# Patient Record
Sex: Male | Born: 2000
Health system: Southern US, Community
[De-identification: ages and names within clinical notes are randomized; demographics above are authoritative.]

## PROBLEM LIST (undated history)

## (undated) HISTORY — PX: TYMPANOSTOMY TUBE PLACEMENT: SHX32

## (undated) HISTORY — PX: HYDROCELE EXCISION / REPAIR: SUR1145

---
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
## 2020-02-04 ENCOUNTER — Encounter: Payer: Self-pay | Admitting: *Deleted

## 2020-02-05 ENCOUNTER — Encounter: Payer: Self-pay | Admitting: Diagnostic Neuroimaging

## 2020-02-05 ENCOUNTER — Ambulatory Visit (INDEPENDENT_AMBULATORY_CARE_PROVIDER_SITE_OTHER): Payer: BC Managed Care – PPO | Admitting: Diagnostic Neuroimaging

## 2020-02-05 VITALS — BP 117/73 | HR 70 | Ht 74.0 in | Wt 148.0 lb

## 2020-02-05 DIAGNOSIS — G252 Other specified forms of tremor: Secondary | ICD-10-CM | POA: Diagnosis not present

## 2020-02-05 NOTE — Progress Notes (Signed)
[facility]  PATIENT: Jeff Boyd DOB: 02/15/2001  REFERRING CLINICIAN: Loyola Mast, MD HISTORY FROM: patient and mother  REASON FOR VISIT: new consult    HISTORICAL  CHIEF COMPLAINT:  Chief Complaint  Patient presents with  . Hand tremors    rm 6 New Pt mom- Jeff Boyd "both hands x 1 1/2 years"    HISTORY OF PRESENT ILLNESS:   19 year old male here for evaluation of tremor.  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  This was about 1-1/2 years ago.  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  Symptoms are stable.  No family history of tremor.   REVIEW OF SYSTEMS: Full 14 system review of systems performed and negative with exception of: As per HPI.  ALLERGIES: No Known Allergies  HOME MEDICATIONS: Outpatient Medications Prior to Visit  Medication Sig Dispense Refill  . albuterol (VENTOLIN HFA) 108 (90 Base) MCG/ACT inhaler Use 2 puffs every 4 hours  as needed for wheezing or  shortness of breath 1 Inhaler 1  . budesonide (PULMICORT FLEXHALER) 180 MCG/ACT inhaler INHALE 1 PUFF INTO THE  LUNGS DAILY. INCREASE TO 3  INHALATIONS 3 TIMES DAILY  WITH FLAREUP AS DIRECTED 1 each 5  . cetirizine (ZYRTEC) 10 MG tablet Take 1 tablet (10 mg total) by mouth daily. 30 tablet 5  . bacitracin ointment Apply 1 application topically 2 (two) times daily. (Patient not taking: Reported on 02/05/2020) 120 g 0  . HYDROcodone-acetaminophen (NORCO/VICODIN) 5-325 MG tablet Take 1 tablet by mouth every 6 (six) hours as needed. (Patient not taking: Reported on 02/05/2020) 10 tablet 0  . ibuprofen (ADVIL,MOTRIN) 200 MG tablet Take 400 mg by mouth every 6 (six) hours as needed. Reported on 07/07/2015 (Patient not taking: Reported on 02/05/2020)     No facility-administered medications prior to visit.    PAST MEDICAL  HISTORY: Past Medical History:  Diagnosis Date  . Asthma     PAST SURGICAL HISTORY: Past Surgical History:  Procedure Laterality Date  . HYDROCELE EXCISION / REPAIR  2004  . TYMPANOSTOMY TUBE PLACEMENT      FAMILY HISTORY: Family History  Problem Relation Age of Onset  . Lymphoma Mother   . Prostate cancer Father   . Stroke Paternal Grandmother   . Diabetes Paternal Grandmother   . Heart attack Paternal Grandfather   . Throat cancer Maternal Grandfather     SOCIAL HISTORY: Social History   Socioeconomic History  . Marital status: Single    Spouse name: Not on file  . Number of children: 0  . Years of education: Not on file  . Highest education level: High school graduate  Occupational History    Comment: student [employer]    Comment: restaurant  Tobacco Use  . Smoking status: Never Smoker  . Smokeless tobacco: Never Used  Substance and Sexual Activity  . Alcohol use: No  . Drug use: No  . Sexual activity: Not on file  Other Topics Concern  . Not on file  Social History Narrative   150 mg caffeine, pre work out drink   Social Determinants of Corporate investment banker Strain:   . Difficulty of Paying Living Expenses:   Food Insecurity:   . Worried About Programme researcher, broadcasting/film/video in the Last Year:   . [employer]  of Food in the Last Year:   Transportation Needs:   . Freight forwarder (Medical):   Marland Kitchen Lack of Transportation (Non-Medical):   Physical Activity:   . Days of Exercise per Week:   . Minutes of Exercise per Session:   Stress:   . Feeling of Stress :   Social Connections:   . Frequency of Communication with Friends and Family:   . Frequency of Social Gatherings with Friends and Family:   . Attends Religious Services:   . Active Member of Clubs or Organizations:   . Attends Banker Meetings:   Marland Kitchen Marital Status:   Intimate Partner Violence:   . Fear of Current or Ex-Partner:   . Emotionally Abused:   Marland Kitchen Physically Abused:   . Sexually  Abused:      PHYSICAL EXAM  GENERAL EXAM/CONSTITUTIONAL: Vitals:  Vitals:   02/05/20 1011  BP: 117/73  Pulse: 70  Weight: 148 lb (67.1 kg)  Height: 6\' 2"  (1.88 m)     Body mass index is 19 kg/m. Wt Readings from Last 3 Encounters:  02/05/20 148 lb (67.1 kg) (39 %, Z= -0.27)*  12/13/18 134 lb (60.8 kg) (23 %, Z= -0.75)*  12/11/18 130 lb (59 kg) (17 %, Z= -0.97)*   * Growth percentiles are based on CDC (Boys, 2-20 Years) data.     Patient is in no distress; well developed, nourished and groomed; neck is supple  CARDIOVASCULAR:  Examination of carotid arteries is normal; no carotid bruits  Regular rate and rhythm, no murmurs  Examination of peripheral vascular system by observation and palpation is normal  EYES:  Ophthalmoscopic exam of optic discs and posterior segments is normal; no papilledema or hemorrhages  No exam data present  MUSCULOSKELETAL:  Gait, strength, tone, movements noted in Neurologic exam below  NEUROLOGIC: MENTAL STATUS:  No flowsheet data found.  awake, alert, oriented to person, place and time  recent and remote memory intact  normal attention and concentration  language fluent, comprehension intact, naming intact  fund of knowledge appropriate  CRANIAL NERVE:   2nd - no papilledema on fundoscopic exam  2nd, 3rd, 4th, 6th - pupils equal and reactive to light, visual fields full to confrontation, extraocular muscles intact, no nystagmus  5th - facial sensation symmetric  7th - facial strength symmetric  8th - hearing intact  9th - palate elevates symmetrically, uvula midline  11th - shoulder shrug symmetric  12th - tongue protrusion midline  MOTOR:   normal bulk and tone, full strength in the BUE, BLE  FINE POSTURAL TREMOR IN HANDS  SENSORY:   normal and symmetric to light touch, temperature, vibration  COORDINATION:   finger-nose-finger, fine finger movements normal  REFLEXES:   deep tendon reflexes  present and symmetric  GAIT/STATION:   narrow based gait     DIAGNOSTIC DATA (LABS, IMAGING, TESTING) - I reviewed patient records, labs, notes, testing and imaging myself where available.  Lab Results  Component Value Date   WBC 5.9 09/04/2018   HGB 13.9 09/04/2018   HCT 41.9 09/04/2018   MCV 85.7 09/04/2018   PLT 250 09/04/2018      Component Value Date/Time   NA 137 09/04/2018 0015   K 3.6 09/04/2018 0015   CL 104 09/04/2018 0015   CO2 26 09/04/2018 0015   GLUCOSE 83 09/04/2018 0015   BUN 10 09/04/2018 0015   CREATININE 0.48 (L) 09/04/2018 0015   CALCIUM 9.5 09/04/2018 0015   PROT 7.3 09/04/2018 0015  ALBUMIN 4.5 09/04/2018 0015   AST 15 09/04/2018 0015   ALT 11 09/04/2018 0015   ALKPHOS 74 09/04/2018 0015   BILITOT 2.4 (H) 09/04/2018 0015   GFRNONAA >60 09/04/2018 0015   GFRAA >60 09/04/2018 0015   No results found for: CHOL, HDL, LDLCALC, LDLDIRECT, TRIG, CHOLHDL No results found for: XYIA1K No results found for: VITAMINB12 No results found for: TSH      ASSESSMENT AND PLAN  19 y.o. year old male here with mild, fine postural tremor in outstretched arms, likely represents enhanced physiologic tremor.  Will check lab testing to rule out other causes.  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 Symptoms are not progressive at this time.  Recommend to monitor.    Dx:  1. Postural tremor     PLAN:  POSTURAL TREMOR (enhanced physiologic tremor) - check labs  Orders Placed This Encounter  Procedures  . TSH  . CBC with Differential/Platelet  . Comprehensive metabolic panel   Return for pending if symptoms worsen or fail to improve.    Suanne Marker, MD 02/05/2020, 10:44 AM Certified in Neurology, Neurophysiology and Neuroimaging  [facility] [address], [address] [place], [place] [address] [phone]

## 2020-02-06 ENCOUNTER — Telehealth: Payer: Self-pay | Admitting: *Deleted

## 2020-02-06 LAB — CBC WITH DIFFERENTIAL/PLATELET
Basophils Absolute: 0.1 10*3/uL (ref 0.0–0.2)
Basos: 1 %
EOS (ABSOLUTE): 0.3 10*3/uL (ref 0.0–0.4)
Eos: 7 %
Hematocrit: 46.6 % (ref 37.5–51.0)
Hemoglobin: 15.4 g/dL (ref 13.0–17.7)
Immature Grans (Abs): 0 10*3/uL (ref 0.0–0.1)
Immature Granulocytes: 0 %
Lymphocytes Absolute: 2 10*3/uL (ref 0.7–3.1)
Lymphs: 45 %
MCH: 28.7 pg (ref 26.6–33.0)
MCHC: 33 g/dL (ref 31.5–35.7)
MCV: 87 fL (ref 79–97)
Monocytes Absolute: 0.4 10*3/uL (ref 0.1–0.9)
Monocytes: 8 %
Neutrophils Absolute: 1.7 10*3/uL (ref 1.4–7.0)
Neutrophils: 39 %
Platelets: 221 10*3/uL (ref 150–450)
RBC: 5.36 x10E6/uL (ref 4.14–5.80)
RDW: 12.9 % (ref 11.6–15.4)
WBC: 4.4 10*3/uL (ref 3.4–10.8)

## 2020-02-06 LAB — COMPREHENSIVE METABOLIC PANEL
ALT: 11 IU/L (ref 0–44)
AST: 14 IU/L (ref 0–40)
Albumin/Globulin Ratio: 2 (ref 1.2–2.2)
Albumin: 5.1 g/dL (ref 4.1–5.2)
Alkaline Phosphatase: 103 IU/L (ref 55–125)
BUN/Creatinine Ratio: 24 — ABNORMAL HIGH (ref 9–20)
BUN: 17 mg/dL (ref 6–20)
Bilirubin Total: 3.9 mg/dL — ABNORMAL HIGH (ref 0.0–1.2)
CO2: 24 mmol/L (ref 20–29)
Calcium: 10.4 mg/dL — ABNORMAL HIGH (ref 8.7–10.2)
Chloride: 98 mmol/L (ref 96–106)
Creatinine, Ser: 0.72 mg/dL — ABNORMAL LOW (ref 0.76–1.27)
GFR calc Af Amer: 156 mL/min/{1.73_m2} (ref >59)
GFR calc non Af Amer: 135 mL/min/{1.73_m2} (ref >59)
Globulin, Total: 2.5 g/dL (ref 1.5–4.5)
Glucose: 81 mg/dL (ref 65–99)
Potassium: 4.6 mmol/L (ref 3.5–5.2)
Sodium: 137 mmol/L (ref 134–144)
Total Protein: 7.6 g/dL (ref 6.0–8.5)

## 2020-02-06 LAB — TSH: TSH: 1.49 u[IU]/mL (ref 0.450–4.500)

## 2020-02-06 NOTE — Telephone Encounter (Signed)
LVM requesting call back for lab results.  

## 2020-02-06 NOTE — Telephone Encounter (Signed)
Pt returned phone call.  

## 2020-02-06 NOTE — Telephone Encounter (Signed)
Called patient and informed him his  labs re ok, except the bilirubin is still high (first noted 1 year ago). Dr Marjory Lies stated it is not likely related to tremor, but needs follow up with PCP. Patient verbalized understanding, appreciation.

## 2020-06-23 ENCOUNTER — Other Ambulatory Visit: Payer: Self-pay

## 2020-06-23 ENCOUNTER — Ambulatory Visit (INDEPENDENT_AMBULATORY_CARE_PROVIDER_SITE_OTHER): Payer: BC Managed Care – PPO | Admitting: Allergy and Immunology

## 2020-06-23 ENCOUNTER — Encounter: Payer: Self-pay | Admitting: Allergy and Immunology

## 2020-06-23 VITALS — BP 130/92 | HR 107 | Temp 99.6°F | Resp 16 | Ht 74.0 in | Wt 154.4 lb

## 2020-06-23 DIAGNOSIS — J3089 Other allergic rhinitis: Secondary | ICD-10-CM

## 2020-06-23 DIAGNOSIS — J4530 Mild persistent asthma, uncomplicated: Secondary | ICD-10-CM

## 2020-06-23 MED ORDER — ALBUTEROL SULFATE HFA 108 (90 BASE) MCG/ACT IN AERS: INHALATION_SPRAY | RESPIRATORY_TRACT | 2 refills | Status: DC

## 2020-06-23 MED ORDER — PULMICORT FLEXHALER 180 MCG/ACT IN AEPB: INHALATION_SPRAY | RESPIRATORY_TRACT | 5 refills | Status: DC

## 2020-06-23 NOTE — Progress Notes (Signed)
[place] [facility] [place] [facility] [place] [facility] [place] [facility] [place]   Follow-up Note  Referring Provider: Loyola Mast, MD Primary Provider: Loyola Mast, MD Date of Office Visit: 06/23/2020  Subjective:   Jeff Boyd (DOB: 2000/07/14) is a 19 y.o. male who [facility]2020 in re-evaluation of the following:  HPI: Jeff Boyd returns to this clinic in evaluation of asthma and allergic rhinitis.  His last visit to this clinic was 03 January 2018.  He did well for years without the use of any controller agents but unfortunately he developed a "cold" in October 2021 and since then has been having recurrent issues with wheezing and coughing especially upon awakening in the morning and uses a bronchodilator on a daily basis.  His "cold" involving his upper airways has completely resolved without any upper airway symptomatology at this point in time.  He does not have any issues with reflux.  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  In the past he has had very good results while utilizing low-dose Pulmicort to control his asthma.  He has received 2 Pfizer Covid vaccines.  He has not received the flu vaccine to date.  Allergies as of 06/23/2020   No Known Allergies     Medication List    albuterol 108 (90 Base) MCG/ACT inhaler Commonly known as: Ventolin HFA Use 2 puffs every 4 hours  as needed for wheezing or  shortness of breath   cetirizine 10 MG tablet Commonly known as: ZYRTEC Take 1 tablet (10 mg total) by mouth daily.   Pulmicort Flexhaler 180 MCG/ACT inhaler Generic drug: budesonide 2 puffs 3-7 times per week depending on disease activity What changed: additional instructions Changed by: Jeff Boyd Jeff Pollock, MD       Past Medical History:  Diagnosis Date  . Asthma     Past Surgical History:  Procedure  Laterality Date  . HYDROCELE EXCISION / REPAIR  2004  . TYMPANOSTOMY TUBE PLACEMENT      Review of systems negative except as noted in HPI / PMHx or noted below:  Review of Systems  Constitutional: Negative.   HENT: Negative.   Eyes: Negative.   Respiratory: Negative.   Cardiovascular: Negative.   Gastrointestinal: Negative.   Genitourinary: Negative.   Musculoskeletal: Negative.   Skin: Negative.   Neurological: Negative.   Endo/Heme/Allergies: Negative.   Psychiatric/Behavioral: Negative.      Objective:   Vitals:   06/23/20 0855  BP: (!) 130/92  Pulse: (!) 107  Resp: 16  Temp: 99.6 F (37.6 C)  SpO2: 96%   Height: 6\' 2"  (188 cm)  Weight: 154 lb 6.4 oz (70 kg)   Physical Exam Constitutional:      Appearance: He is not diaphoretic.  HENT:     Head: Normocephalic.     Right Ear: Tympanic membrane, ear canal and external ear normal.     Left Ear: Tympanic membrane, ear canal and external ear normal.     Nose: Nose normal. No mucosal edema or rhinorrhea.     Mouth/Throat:     Mouth: Oropharynx is clear and moist and mucous membranes are normal.     Pharynx: Uvula midline. No oropharyngeal exudate.  Eyes:     Conjunctiva/sclera: Conjunctivae normal.  Neck:     Thyroid: No thyromegaly.     Trachea: Trachea normal. No  tracheal tenderness or tracheal deviation.  Cardiovascular:     Rate and Rhythm: Normal rate and regular rhythm.     Heart sounds: Normal heart sounds, S1 normal and S2 normal. No murmur heard.   Pulmonary:     Effort: No respiratory distress.     Breath sounds: Normal breath sounds. No stridor. No wheezing or rales.  Musculoskeletal:        General: No edema.  Lymphadenopathy:     Head:     Right side of head: No tonsillar adenopathy.     Left side of head: No tonsillar adenopathy.     Cervical: No cervical adenopathy.  Skin:    Findings: No erythema or rash.     Nails: There is no clubbing.  Neurological:     Mental Status: He is  alert.     Diagnostics:    Spirometry was performed and demonstrated an FEV1 of 3.64 at 70 % of predicted.  Assessment and Plan:   1. Not well controlled mild persistent asthma   2. Other allergic rhinitis     1.  Pulmicort 180 -2 inhalations 3-7 times per week depending on disease activity  2.  If needed:   A.  Albuterol HFA-2 inhalations every 4-6 hours  B.  OTC antihistamine  3.  Prednisone 10 mg -2 tablets daily x 5 days, then 1 tablet daily x 5 days  4.  Obtain fall flu vaccine  5.  Return to clinic in 1 year or earlier if problem  I will have Jeff Boyd restart his Pulmicort and I given him some oral steroids today to jumpstart the anti-inflammatory effect of this approach.  Assuming he does well with this plan I will see him back in his clinic in approximately 1 year.  He does have a very good understanding of his disease state and understands about adjusting the dose of Pulmicort depending on disease activity.  Jeff Schimke, MD Allergy / Immunology [facility]

## 2020-06-23 NOTE — Patient Instructions (Addendum)
  1.  Pulmicort 180 -2 inhalations 3-7 times per week depending on disease activity  2.  If needed:   A.  Albuterol HFA-2 inhalations every 4-6 hours  B.  OTC antihistamine  3.  Prednisone 10 mg -2 tablets daily x 5 days, then 1 tablet daily x 5 days  4.  Obtain fall flu vaccine  5.  Return to clinic in 1 year or earlier if problem

## 2020-06-24 ENCOUNTER — Encounter: Payer: Self-pay | Admitting: Allergy and Immunology

## 2022-07-12 ENCOUNTER — Ambulatory Visit: Payer: BC Managed Care – PPO | Admitting: Allergy and Immunology

## 2022-07-12 DIAGNOSIS — J309 Allergic rhinitis, unspecified: Secondary | ICD-10-CM

## 2022-08-16 ENCOUNTER — Ambulatory Visit: Payer: BC Managed Care – PPO | Admitting: Internal Medicine

## 2022-08-21 NOTE — Patient Instructions (Incomplete)
For now and during asthma flares/upper respiratory infections start  Pulmicort 180 mcg -2 inhalations twice a day until symptoms return to baseline   Asthma control goals:  Full participation in all desired activities (may need albuterol before activity) Albuterol use two time or less a week on average (not counting use with activity) Cough interfering with sleep two time or less a month Oral steroids no more than once a year No hospitalizations  2.  If needed:               A.  Albuterol HFA-2 inhalations every 4-6 hours             B.  OTC antihistamine  C. Flonase 1-2 sprays in each nostril once a day if needed for stuffy nose. In the right nostril, point the applicator out toward the right ear. In the left nostril, point the applicator out toward the left ear. Reviewed stopping Flonase if notices correlation of use with nose bleeds  3. Epistaxis ( nose bleeds) Pinch both nostrils while leaning forward for at least 5 minutes before checking to see if the bleeding has stopped. If bleeding is not controlled within 5-10 minutes apply a cotton ball soaked with oxymetazoline (Afrin) to the bleeding nostril for a few seconds.  If the problem persists or worsens a referral to ENT for further evaluation may be necessary.  4 Reflux Start dietary and lifestyle modifications as below    4.  Return to clinic in 6 months or earlier if problem   Lifestyle Changes for Controlling GERD When you have GERD, stomach acid feels as if it's backing up toward your mouth. Whether or not you take medication to control your GERD, your symptoms can often be improved with lifestyle changes.   Raise Your Head Reflux is more likely to strike when you're lying down flat, because stomach fluid can flow backward more easily. Raising the head of your bed 4-6 inches can help. To do this: Slide blocks or books under the legs at the head of your bed. Or, place a wedge under the mattress. Many foam stores can make a  suitable wedge for you. The wedge should run from your waist to the top of your head. Don't just prop your head on several pillows. This increases pressure on your stomach. It can make GERD worse.  Watch Your Eating Habits Certain foods may increase the acid in your stomach or relax the lower esophageal sphincter, making GERD more likely. It's best to avoid the following: Coffee, tea, and carbonated drinks (with and without caffeine) Fatty, fried, or spicy food Mint, chocolate, onions, and tomatoes Any other foods that seem to irritate your stomach or cause you pain  Relieve the Pressure Eat smaller meals, even if you have to eat more often. Don't lie down right after you eat. Wait a few hours for your stomach to empty. Avoid tight belts and tight-fitting clothes. Lose excess weight.  Tobacco and Alcohol Avoid smoking tobacco and drinking alcohol. They can make GERD symptoms worse.

## 2022-08-23 ENCOUNTER — Other Ambulatory Visit: Payer: Self-pay

## 2022-08-23 ENCOUNTER — Encounter: Payer: Self-pay | Admitting: Family

## 2022-08-23 ENCOUNTER — Ambulatory Visit (INDEPENDENT_AMBULATORY_CARE_PROVIDER_SITE_OTHER): Payer: BC Managed Care – PPO | Admitting: Family

## 2022-08-23 VITALS — BP 130/70 | HR 72 | Temp 97.5°F | Resp 16 | Ht 74.0 in | Wt 170.3 lb

## 2022-08-23 DIAGNOSIS — J4530 Mild persistent asthma, uncomplicated: Secondary | ICD-10-CM

## 2022-08-23 DIAGNOSIS — J3089 Other allergic rhinitis: Secondary | ICD-10-CM

## 2022-08-23 DIAGNOSIS — K219 Gastro-esophageal reflux disease without esophagitis: Secondary | ICD-10-CM

## 2022-08-23 DIAGNOSIS — R040 Epistaxis: Secondary | ICD-10-CM

## 2022-08-23 MED ORDER — ALBUTEROL SULFATE (2.5 MG/3ML) 0.083% IN NEBU: 2.5000 mg | INHALATION_SOLUTION | Freq: Four times a day (QID) | RESPIRATORY_TRACT | 1 refills | Status: AC | PRN

## 2022-08-23 MED ORDER — ALBUTEROL SULFATE HFA 108 (90 BASE) MCG/ACT IN AERS: INHALATION_SPRAY | RESPIRATORY_TRACT | 1 refills | Status: AC

## 2022-08-23 MED ORDER — PULMICORT FLEXHALER 180 MCG/ACT IN AEPB: INHALATION_SPRAY | RESPIRATORY_TRACT | 5 refills | Status: AC

## 2022-08-23 NOTE — Addendum Note (Signed)
Addended by: Eloy End D on: 08/23/2022 03:41 PM   Modules accepted: Orders

## 2022-08-23 NOTE — Progress Notes (Signed)
[place] [id number] Dept: [phone]  FOLLOW UP NOTE  Patient ID: Jeff Boyd, male    DOB: [birth year]  Age: 22 y.o. MRN: [id number] Date of Office Visit: 08/23/2022  Assessment  Chief Complaint: Medication Refill  HPI Jeff Boyd is a 22 year old male who presents today for follow-up of not well-controlled mild persistent asthma and allergic rhinitis.  He was last seen on June 23, 2020 by Dr. Neldon Mc.  He denies any new diagnosis or surgery since his last office visit.  His wife is here with him today and helps provide history.  Mild persistent asthma: He reports that he is just recently getting over influenza and with the influenza he has had slight coughing and wheezing.  He also reports shortness of breath when his allergies flareup.  This especially occurs when he goes to his in-laws house who have a cat.  He is allergic to cats.  He denies tightness in his chest and nocturnal awakenings due to breathing problems.  His wife reports that while he has had the influenza he has been using his albuterol 2-3 times a week.  Prior to having influenza he was using his albuterol maybe once a week.  He has not used Pulmicort Flexhaler 180 mcg in over a year.  Since his last office visit he has not required any systemic steroids or made any trips to the emergency room or urgent care due to breathing problems.  Allergic rhinitis: He reports once a week he will have rhinorrhea, nasal congestion, postnasal drip, and a nosebleed.  His nose bleed will last maybe 10 seconds and then will stop.  Also he mentions that when  he blows his nose he will sometimes see blood.  He denies nasal dryness.  He has not had any sinus infections since we last saw him.  He continues to take over-the-counter Zyrtec 10 mg once a day and very rarely uses Flonase nasal spray as needed.  He is not interested in this time and a referral to ENT for his nosebleeds.  Reflux: He reports reflux symptoms  that occur maybe once every other week.  He does drink 1-2 sodas a day and does not eat much chocolate.  He does not currently take any medication for reflux.   Drug Allergies:  No Known Allergies  Review of Systems: Review of Systems  Constitutional:  Negative for chills and fever.  HENT:  Positive for nosebleeds.        Reports rhinorrhea, nasal congestion, and postnasal drip that occurs approximately once a week.  He also reports nosebleeds that occur once a week.  Eyes:        Denies itchy watery eyes  Respiratory:  Positive for cough, shortness of breath and wheezing.        Reports light cough or wheeze since just getting over influenza.  He also reports shortness of breath with allergy flareups.  He reports that his wife's parents have a cat and he is allergic to cats.  Cardiovascular:  Negative for chest pain and palpitations.  Gastrointestinal:        Reports reflux symptoms once every other week  Genitourinary:  Negative for frequency.  Skin:  Negative for itching and rash.  Neurological:  Negative for headaches.  Endo/Heme/Allergies:  Positive for environmental allergies.     Physical Exam: BP 130/70   Pulse 72   Temp (!) 97.5 F (36.4 C) (Temporal)   Resp 16   Ht '6\' 2"'$  (  1.88 m)   Wt 170 lb 4.8 oz (77.2 kg)   SpO2 98%   BMI 21.87 kg/m    Physical Exam Exam conducted with a chaperone present.  Constitutional:      Appearance: Normal appearance.  HENT:     Head: Normocephalic and atraumatic.     Comments: Pharynx normal, eyes normal, ears normal, nose: Slight irritation noted in right nostril with dried blood    Right Ear: Tympanic membrane, ear canal and external ear normal.     Left Ear: Tympanic membrane, ear canal and external ear normal.     Mouth/Throat:     Mouth: Mucous membranes are moist.     Pharynx: Oropharynx is clear.  Eyes:     Conjunctiva/sclera: Conjunctivae normal.  Cardiovascular:     Rate and Rhythm: Normal rate and regular rhythm.      Heart sounds: Normal heart sounds.  Pulmonary:     Effort: Pulmonary effort is normal.     Breath sounds: Normal breath sounds.     Comments: Lungs clear to auscultation Musculoskeletal:     Cervical back: Neck supple.  Skin:    General: Skin is warm.  Neurological:     Mental Status: He is alert and oriented to person, place, and time.  Psychiatric:        Mood and Affect: Mood normal.        Behavior: Behavior normal.        Thought Content: Thought content normal.        Judgment: Judgment normal.     Diagnostics: FVC 6.11 L (96%), FEV1 4.22 L (79%).  Spirometry indicates moderate airway obstruction.  Postbronchodilator response shows FVC 6.33 L (99%), FEV1 4.57 L (86%).  There is a 8% change in FEV1.  Spirometry indicates airway obstruction.  Assessment and Plan: 1. Not well controlled mild persistent asthma   2. Other allergic rhinitis   3. Epistaxis   4. Gastroesophageal reflux disease, unspecified whether esophagitis present     Meds ordered this encounter  Medications   budesonide (PULMICORT FLEXHALER) 180 MCG/ACT inhaler    Sig: During asthma flares/upper respiratory infections take 2 puffs twice a day until symptoms return to baseline    Dispense:  1 each    Refill:  5   albuterol (VENTOLIN HFA) 108 (90 Base) MCG/ACT inhaler    Sig: Use 2 puffs every 4 hours  as needed for wheezing or  shortness of breath    Dispense:  18 g    Refill:  1   albuterol (PROVENTIL) (2.5 MG/3ML) 0.083% nebulizer solution    Sig: Take 3 mLs (2.5 mg total) by nebulization every 6 (six) hours as needed for wheezing or shortness of breath.    Dispense:  75 mL    Refill:  1    Patient Instructions  For now and during asthma flares/upper respiratory infections start  Pulmicort 180 mcg -2 inhalations twice a day until symptoms return to baseline   Asthma control goals:  Full participation in all desired activities (may need albuterol before activity) Albuterol use two time or less a  week on average (not counting use with activity) Cough interfering with sleep two time or less a month Oral steroids no more than once a year No hospitalizations  2.  If needed:               A.  Albuterol HFA-2 inhalations every 4-6 hours  B.  OTC antihistamine  C. Flonase 1-2 sprays in each nostril once a day if needed for stuffy nose. In the right nostril, point the applicator out toward the right ear. In the left nostril, point the applicator out toward the left ear. Reviewed stopping Flonase if notices correlation of use with nose bleeds  3. Epistaxis ( nose bleeds) Pinch both nostrils while leaning forward for at least 5 minutes before checking to see if the bleeding has stopped. If bleeding is not controlled within 5-10 minutes apply a cotton ball soaked with oxymetazoline (Afrin) to the bleeding nostril for a few seconds.  If the problem persists or worsens a referral to ENT for further evaluation may be necessary.  4 Reflux Start dietary and lifestyle modifications as below    4.  Return to clinic in 6 months or earlier if problem   Lifestyle Changes for Controlling GERD When you have GERD, stomach acid feels as if it's backing up toward your mouth. Whether or not you take medication to control your GERD, your symptoms can often be improved with lifestyle changes.   Raise Your Head Reflux is more likely to strike when you're lying down flat, because stomach fluid can flow backward more easily. Raising the head of your bed 4-6 inches can help. To do this: Slide blocks or books under the legs at the head of your bed. Or, place a wedge under the mattress. Many foam stores can make a suitable wedge for you. The wedge should run from your waist to the top of your head. Don't just prop your head on several pillows. This increases pressure on your stomach. It can make GERD worse.  Watch Your Eating Habits Certain foods may increase the acid in your stomach or relax  the lower esophageal sphincter, making GERD more likely. It's best to avoid the following: Coffee, tea, and carbonated drinks (with and without caffeine) Fatty, fried, or spicy food Mint, chocolate, onions, and tomatoes Any other foods that seem to irritate your stomach or cause you pain  Relieve the Pressure Eat smaller meals, even if you have to eat more often. Don't lie down right after you eat. Wait a few hours for your stomach to empty. Avoid tight belts and tight-fitting clothes. Lose excess weight.  Tobacco and Alcohol Avoid smoking tobacco and drinking alcohol. They can make GERD symptoms worse.   Return in about 6 months (around 02/21/2023), or if symptoms worsen or fail to improve.    Thank you for the opportunity to care for this patient.  Please do not hesitate to contact me with questions.  Althea Charon, FNP [facility]

## 2022-08-24 ENCOUNTER — Telehealth: Payer: Self-pay | Admitting: Family

## 2022-08-24 ENCOUNTER — Other Ambulatory Visit: Payer: Self-pay | Admitting: *Deleted

## 2022-08-24 NOTE — Telephone Encounter (Signed)
Please let me know what inhaled corticosteroid inhalers such as Flovent HFA/diskus, Qvar, Arnuity, Asmanex HFA, or Asmanex Twisthaler are covered.

## 2022-08-24 NOTE — Telephone Encounter (Signed)
Pulmicort flexhaler is no longer covered by pts inusrnace please advise to change in therapy

## 2022-08-24 NOTE — Telephone Encounter (Signed)
Patient wife called and said they got a notice on my charts saying that the pulmicort can not be filled.  [facility] [place]// [phone]

## 2022-08-24 NOTE — Telephone Encounter (Signed)
Fluticasone HFA and Diskus are preferred at a level 6, QVAR and Arnuity and preferred level 2, Asmanex is not covered.

## 2022-08-25 MED ORDER — ARNUITY ELLIPTA 200 MCG/ACT IN AEPB: INHALATION_SPRAY | RESPIRATORY_TRACT | 1 refills | Status: AC

## 2022-08-25 NOTE — Addendum Note (Signed)
Addended by: Tommas Olp B on: 08/25/2022 02:43 PM   Modules accepted: Orders

## 2022-08-25 NOTE — Telephone Encounter (Signed)
Lets change him to Arnuity 200 mcg 1 puff once a day during asthma flares/upper respiratory infections. Have him continue Arnuity 200 mcg 1 puff once a day until his symptoms return to baseline and then he can set Arnuity to the side until next time he has symptoms.

## 2022-08-25 NOTE — Telephone Encounter (Signed)
Please also send in the prescription for Arnuity 200 mcg as above if you have not already.

## 2022-08-25 NOTE — Telephone Encounter (Signed)
Called patient - DOB/Pharmacy verified - advised of provider notation below.  Patient verbalized understanding, no further questions.  Arnuity Ellipta 200 mcg 1 puff 1(ONE) Time daily

## 2023-02-21 ENCOUNTER — Other Ambulatory Visit: Payer: Self-pay

## 2023-02-21 ENCOUNTER — Ambulatory Visit: Payer: BC Managed Care – PPO | Admitting: Allergy and Immunology

## 2023-02-21 ENCOUNTER — Encounter: Payer: Self-pay | Admitting: Allergy and Immunology

## 2023-02-21 VITALS — BP 118/84 | HR 80 | Temp 98.6°F | Ht 72.44 in | Wt 172.2 lb

## 2023-02-21 DIAGNOSIS — J4520 Mild intermittent asthma, uncomplicated: Secondary | ICD-10-CM | POA: Diagnosis not present

## 2023-02-21 MED ORDER — AIRSUPRA 90-80 MCG/ACT IN AERO: 2.0000 | INHALATION_SPRAY | Freq: Four times a day (QID) | RESPIRATORY_TRACT | 2 refills | Status: AC | PRN

## 2023-02-21 NOTE — Progress Notes (Unsigned)
[place] [facility] [place] [facility] [place] - [place] - [place]   Follow-up Note  Referring Provider: Shelba Flake, MD Primary Provider: Shelba Flake, MD Date of Office Visit: 02/21/2023  Subjective:   Jeff Boyd (DOB: Jul 17, 2000) is a 22 y.o. male who [facility] in re-evaluation of the following:  HPI: Jeff Boyd returns to this clinic in evaluation of asthma and allergic rhinitis.  I last saw him in this clinic 23 June 2020.  He was last seen by our nurse practitioner on 28 August 2022.  Apparently he had what appears to be a viral induced flareup of his asthma when he was last seen in this clinic but since that point in time he has really done well.  He rarely has any problems with wheezing and coughing and he rarely uses any short acting bronchodilator and he can exert himself without any problem.  Likewise, has had very little problems with his nose and he does not use any antihistamines and he does not use any nasal steroids.  Allergies as of 02/21/2023   No Known Allergies      Medication List    albuterol 108 (90 Base) MCG/ACT inhaler Commonly known as: Ventolin HFA Use 2 puffs every 4 hours  as needed for wheezing or  shortness of breath   albuterol (2.5 MG/3ML) 0.083% nebulizer solution Commonly known as: PROVENTIL Take 3 mLs (2.5 mg total) by nebulization every 6 (six) hours as needed for wheezing or shortness of breath.   cetirizine 10 MG tablet Commonly known as: ZYRTEC Take 1 tablet (10 mg total) by mouth daily.    Past Medical History:  Diagnosis Date   Asthma     Past Surgical History:  Procedure Laterality Date   HYDROCELE EXCISION / REPAIR  2004   TYMPANOSTOMY TUBE PLACEMENT      Review of systems negative except as noted in HPI / PMHx or noted below:  Review of Systems  Constitutional: Negative.   HENT: Negative.    Eyes: Negative.   Respiratory: Negative.    Cardiovascular:  Negative.   Gastrointestinal: Negative.   Genitourinary: Negative.   Musculoskeletal: Negative.   Skin: Negative.   Neurological: Negative.   Endo/Heme/Allergies: Negative.   Psychiatric/Behavioral: Negative.       Objective:   Vitals:   02/21/23 1015  BP: 118/84  Pulse: 80  Temp: 98.6 F (37 C)  SpO2: 97%   Height: 6' 0.44" (184 cm)  Weight: 172 lb 3.2 oz (78.1 kg)   Physical Exam Constitutional:      Appearance: He is not diaphoretic.  HENT:     Head: Normocephalic.     Right Ear: Tympanic membrane, ear canal and external ear normal.     Left Ear: Tympanic membrane, ear canal and external ear normal.     Nose: Nose normal. No mucosal edema or rhinorrhea.     Mouth/Throat:     Pharynx: Uvula midline. No oropharyngeal exudate.  Eyes:     Conjunctiva/sclera: Conjunctivae normal.  Neck:     Thyroid: No thyromegaly.     Trachea: Trachea normal. No tracheal tenderness or tracheal deviation.  Cardiovascular:     Rate and Rhythm: Normal rate and regular rhythm.     Heart sounds: Normal heart sounds, S1 normal and S2 normal. No murmur heard. Pulmonary:     Effort: No respiratory distress.     Breath sounds: Normal breath sounds. No stridor. No wheezing or rales.  Lymphadenopathy:     Head:     Right side of head: No tonsillar adenopathy.     Left side of head: No tonsillar adenopathy.     Cervical: No cervical adenopathy.  Skin:    Findings: No erythema or rash.     Nails: There is no clubbing.  Neurological:     Mental Status: He is alert.     Diagnostics:    Spirometry was performed and demonstrated an FEV1 of 4.50 at 85 % of predicted.  The patient had an Asthma Control Test with the following results: ACT Total Score: 25.    Assessment and Plan:   1. Asthma, mild intermittent, well-controlled    1. Airsupra - 2 inhalations every 6 hours if needed (Coupon)  2. Plan for fall flu vaccine  3. Return to clinic in 1 year or earlier if needed  Jeff Boyd  is doing very well and he has a good understanding about his disease state and how to use his medications appropriately and we will give him an anti-inflammatory rescue medicine to be utilized as needed and should he have problems with this plan he can contact me for further evaluation and treatment.  Laurette Schimke, MD Allergy / Immunology [facility]

## 2023-02-21 NOTE — Patient Instructions (Signed)
  1. Airsupra - 2 inhalations every 6 hours if needed (Coupon)  2. Plan for fall flu vaccine  3. Return to clinic in 1 year or earlier if needed

## 2023-02-22 ENCOUNTER — Encounter: Payer: Self-pay | Admitting: Allergy and Immunology
